# Patient Record
Sex: Male | Born: 1960 | Race: Black or African American | Hispanic: No | Marital: Single | Smoking: Never smoker
Health system: Southern US, Community
[De-identification: ages and names within clinical notes are randomized; demographics above are authoritative.]

## PROBLEM LIST (undated history)

## (undated) DIAGNOSIS — I10 Essential (primary) hypertension: Secondary | ICD-10-CM

## (undated) DIAGNOSIS — Z87442 Personal history of urinary calculi: Secondary | ICD-10-CM

## (undated) DIAGNOSIS — E119 Type 2 diabetes mellitus without complications: Secondary | ICD-10-CM

## (undated) DIAGNOSIS — G4730 Sleep apnea, unspecified: Secondary | ICD-10-CM

## (undated) HISTORY — PX: COLONOSCOPY: SHX174

## (undated) HISTORY — PX: KNEE SURGERY: SHX244

---

## 2006-02-21 ENCOUNTER — Ambulatory Visit: Payer: Self-pay

## 2006-04-12 ENCOUNTER — Ambulatory Visit: Payer: Self-pay | Admitting: General Practice

## 2006-04-30 ENCOUNTER — Emergency Department: Payer: Self-pay | Admitting: Emergency Medicine

## 2009-09-28 ENCOUNTER — Ambulatory Visit: Payer: Self-pay | Admitting: Internal Medicine

## 2009-11-06 ENCOUNTER — Ambulatory Visit: Payer: Self-pay | Admitting: General Practice

## 2009-11-12 ENCOUNTER — Ambulatory Visit: Payer: Self-pay | Admitting: Cardiovascular Disease

## 2009-11-19 ENCOUNTER — Ambulatory Visit: Payer: Self-pay | Admitting: General Practice

## 2012-09-04 ENCOUNTER — Ambulatory Visit: Payer: Self-pay | Admitting: Unknown Physician Specialty

## 2012-09-17 ENCOUNTER — Emergency Department: Payer: Self-pay | Admitting: Emergency Medicine

## 2014-06-21 DIAGNOSIS — R7989 Other specified abnormal findings of blood chemistry: Secondary | ICD-10-CM | POA: Insufficient documentation

## 2016-10-22 ENCOUNTER — Other Ambulatory Visit: Payer: Self-pay | Admitting: Student

## 2016-10-22 DIAGNOSIS — R1310 Dysphagia, unspecified: Secondary | ICD-10-CM

## 2016-11-01 ENCOUNTER — Ambulatory Visit
Admission: RE | Admit: 2016-11-01 | Discharge: 2016-11-01 | Disposition: A | Discharge status: Home / Self Care | Payer: BLUE CROSS/BLUE SHIELD | Source: Ambulatory Visit | Attending: Student | Admitting: Student

## 2016-11-01 DIAGNOSIS — R1310 Dysphagia, unspecified: Secondary | ICD-10-CM | POA: Insufficient documentation

## 2016-11-01 DIAGNOSIS — K219 Gastro-esophageal reflux disease without esophagitis: Secondary | ICD-10-CM | POA: Insufficient documentation

## 2017-05-06 DIAGNOSIS — E1165 Type 2 diabetes mellitus with hyperglycemia: Secondary | ICD-10-CM | POA: Insufficient documentation

## 2017-05-26 ENCOUNTER — Ambulatory Visit: Payer: BLUE CROSS/BLUE SHIELD | Admitting: Dietician

## 2017-05-31 ENCOUNTER — Encounter: Payer: Self-pay | Admitting: Dietician

## 2017-05-31 NOTE — Progress Notes (Signed)
Have not heard back from patient to reschedule his cancelled appointment. Sent letter to referring provider.  

## 2018-02-13 ENCOUNTER — Encounter: Payer: BLUE CROSS/BLUE SHIELD | Admitting: Dietician

## 2018-02-20 ENCOUNTER — Encounter: Payer: Self-pay | Admitting: Dietician

## 2018-02-20 ENCOUNTER — Encounter: Payer: BLUE CROSS/BLUE SHIELD | Attending: Internal Medicine | Admitting: Dietician

## 2018-02-20 VITALS — Ht 68.0 in | Wt 231.6 lb

## 2018-02-20 DIAGNOSIS — E119 Type 2 diabetes mellitus without complications: Secondary | ICD-10-CM

## 2018-02-20 NOTE — Patient Instructions (Addendum)
Balance meals with 3-4 servings of carbohydrate (45-60gms), protein and non-starchy vegetables. Use myfitness pal app to record food/beverage intake. Limit high fat food choices: fried foods, added fats such as margarine, salad dressings, and mayonnaise. Exercise goal:  In addition to dancing, exercise 3 days per week for 45-60 minutes- cardio, core exercises.

## 2018-02-20 NOTE — Progress Notes (Signed)
   Medical Nutrition Therapy: Visit start time: 1330  end time: 1440   Assessment:  Diagnosis: Type 2 DM Past medical history: GERD, hypertension Psychosocial issues/ stress concerns:  Patient rates his stress as moderate and indicates he is dealing "ok" with his stress. He does competitive dancing and list dancing as a way to help cope. His mother has been under care of Hospice and has improved and has recently been released from Hospice care.   Preferred learning method:  . Auditory . Hands-on Current weight: 231.6  Height: 68 in  Medications, supplements: see list  Progress and evaluation:  Patient in for initial medical nutrition therapy appointment. He gives a weight goal of 200 lbs. From 4/'19 to 8/'19, he had a weight gain of 14 lbs. His weight has been stable in the past month. He reports that in the past he has "worked out" but due to health issues with his mother and a demanding work schedule he has not been consistent. He does do competitive dancing. He works 3rd shift most of the time but also goes into work during day hours as well. Most of his meals are "take out" or eaten "out", frequently fast food with high fat content. He states he may cook at home once weekly. He states that with his schedule, most of his meals will continue to be "out". His beverages are water, unsweetened tea with Splenda; sometimes adds lemonade, diet soda. His most recent HgA1c was 8 which was an increase.Marland Kitchen His present diet is high in fat, sodium and low in fruits, vegetables and whole grains.   Physical activity: competitive dancing  Nutrition Care Education:  Weight control/Type 2 Diabetes:  Instructed on a meal plan based on a minimum of 1800 calories and as high as 2300 calories on competitive dance days. Encouraged to record food/beverage intake on a phone app since data base includes restaurants. Discussed making healthier food choices when dining "out".  Nutritional Diagnosis:  [place]-3.3  Overweight/obesity As related to frequent dining out making high fat choices.  As evidenced by diet history..  Intervention: Balance meals with 3-4 servings of carbohydrate (45-60gms), protein and non-starchy vegetables. Discussed difficulty of controlling fat, sodium and calorie intake when dining out frequently. Use myfitness pal app to record food/beverage intake as a tool in being more mindful in making healthier choices. Limit high fat food choices: fried foods, added fats such as margarine, salad dressings, and mayonnaise. Exercise goal:  In addition to dancing, exercise 3 days per week for 45-60 minutes- cardio, core exercises. Education Materials given:  . Plate Planner . Food lists/ Planning A Balanced Meal . Sample meal pattern/ menus . Goals/ instructions Learner/ who was taught:  . Patient  Level of understanding: . Partial understanding; needs review/ practice Demonstrated degree of understanding via:   Teach back Learning barriers: . None Willingness to learn/ readiness for change: . Acceptance, ready for change  Monitoring and Evaluation:  Dietary intake, exercise,  and body weight      follow up: 03/20/18 at 2:00pm

## 2018-03-20 ENCOUNTER — Ambulatory Visit: Payer: BLUE CROSS/BLUE SHIELD | Admitting: Dietician

## 2018-03-30 ENCOUNTER — Encounter: Payer: Self-pay | Admitting: Dietician

## 2018-09-14 IMAGING — RF DG ESOPHAGUS
7 of 8 series · 17 of 23 positions shown · non-contrast
Comparison: None.

CLINICAL DATA: Scratchy throat inflammation

EXAM:
ESOPHOGRAM / BARIUM SWALLOW / BARIUM TABLET STUDY
TECHNIQUE: Combined double contrast and single contrast examination performed
using effervescent crystals, thick barium liquid, and thin barium
liquid. The patient was observed with fluoroscopy swallowing a 13 mm
barium sulphate tablet.
FLUOROSCOPY TIME:  Fluoroscopy Time:  0.8 minutes
Radiation Exposure Index (if provided by the fluoroscopic device): 8
mGy
Number of Acquired Spot Images: 0

[Series 1: cp_standard · 0.52mm/px · 3 of 28 frames shown (1 of 7)]
[frame 2/28]
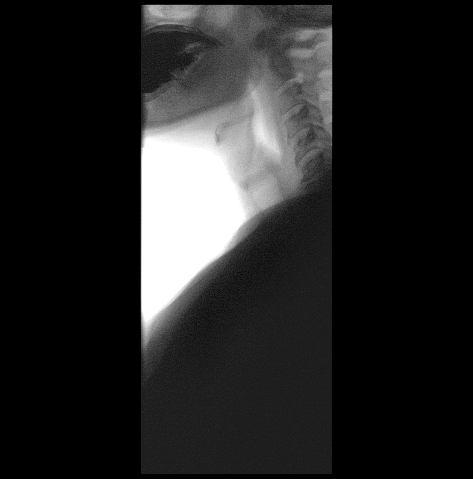
[frame 15/28]
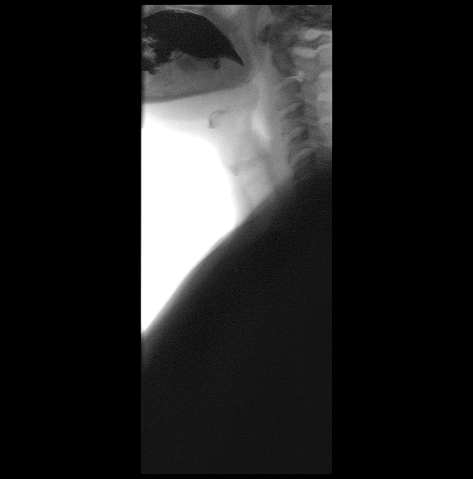
[frame 24/28]
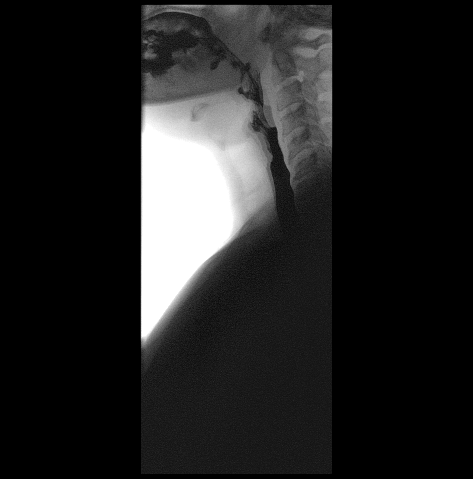

[Series 2: cp_standard · 0.52mm/px · 3 of 62 frames shown (2 of 7)]
[frame 10/62]
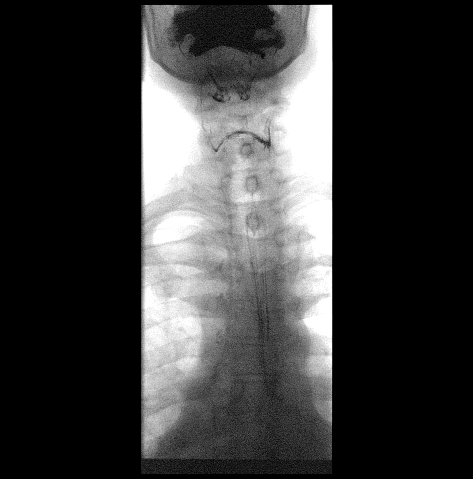
[frame 32/62]
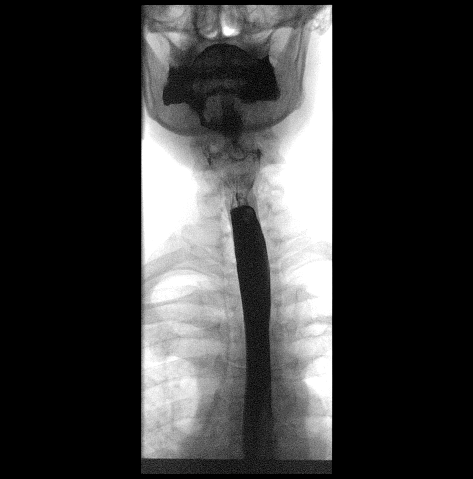
[frame 53/62]
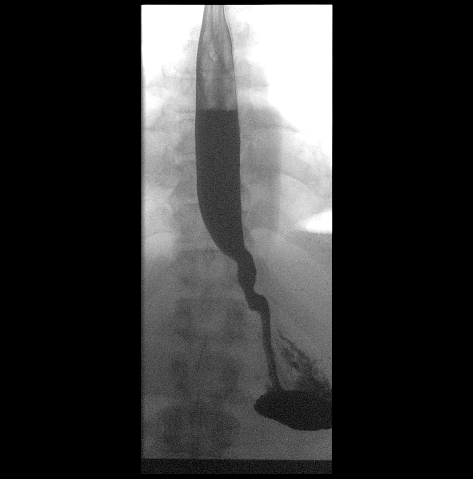

[Series 3: cp_standard · 0.54mm/px · 3 of 55 frames shown (3 of 7)]
[frame 2/55]
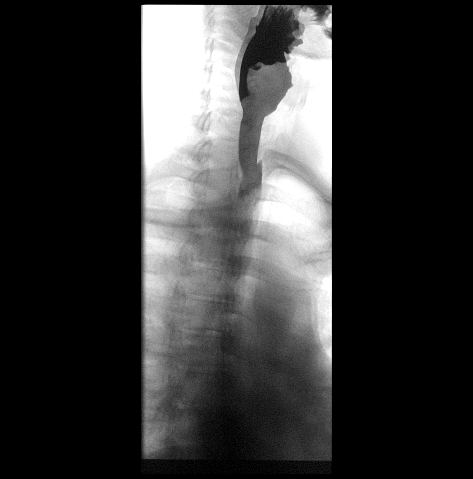
[frame 28/55]
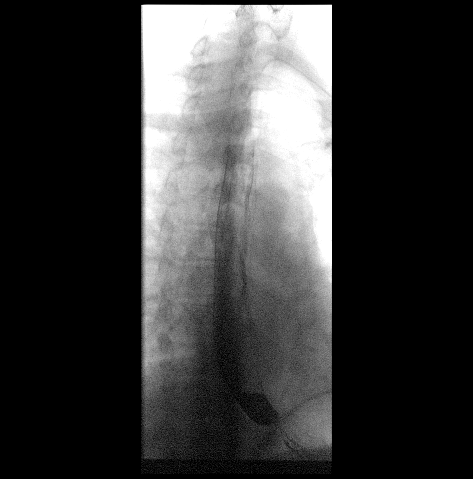
[frame 47/55]
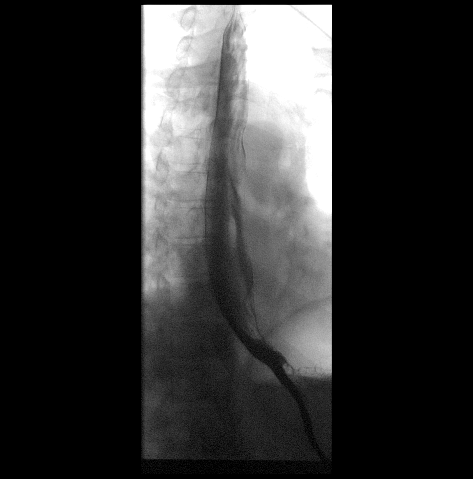

[Series 4: cp_standard · 0.54mm/px · 3 of 8 frames shown (4 of 7)]
[frame 1/8]
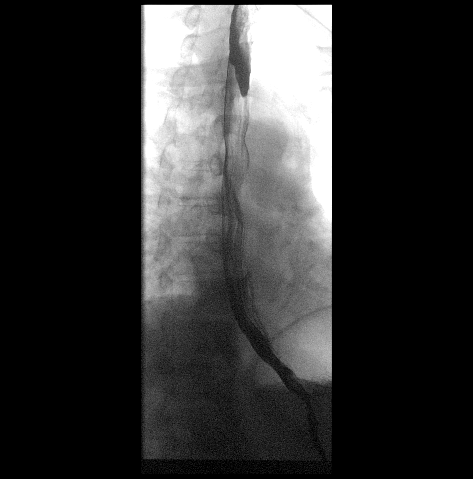
[frame 5/8]
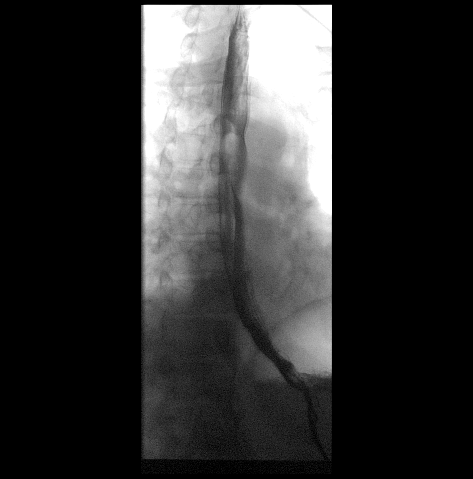
[frame 7/8]
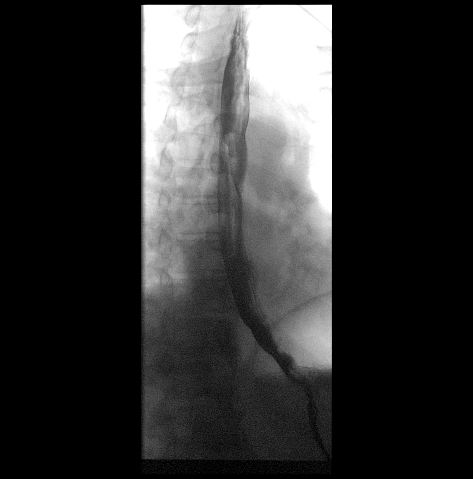

[Series 5: cp_standard · 0.27mm/px · 1 of 1 slices shown (5 of 7)]
[im 1/1]
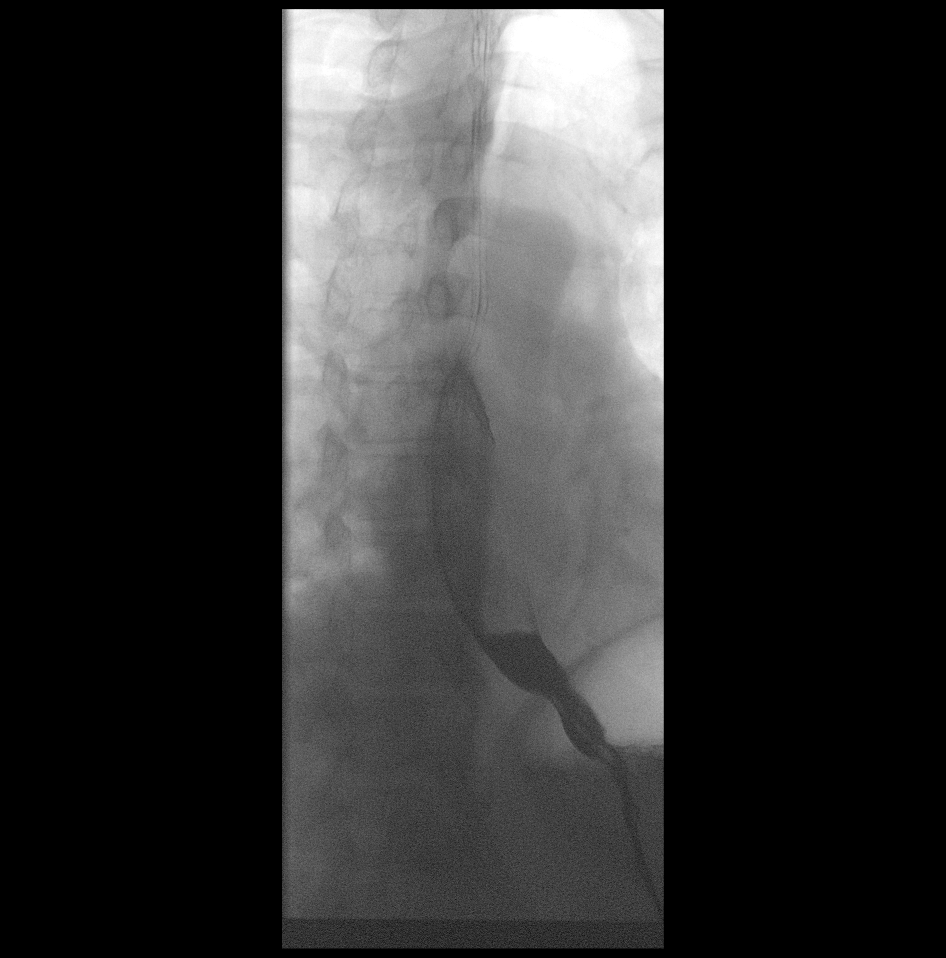

[Series 6: cp_standard · 0.54mm/px · 3 of 41 frames shown (6 of 7)]
[frame 21/41]
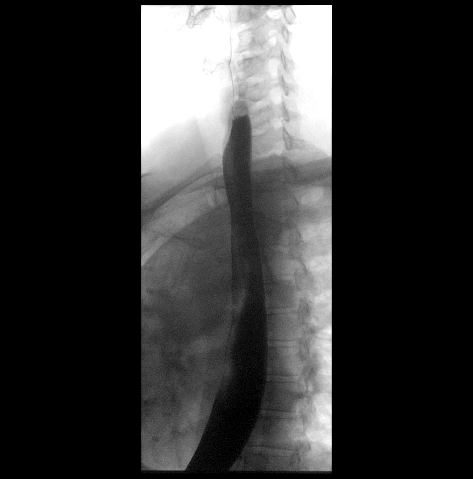
[frame 35/41]
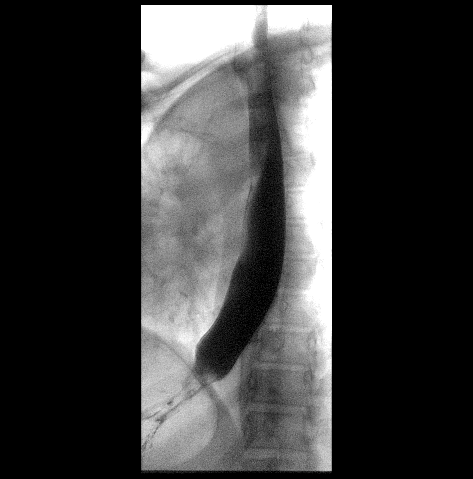
[frame 40/41]
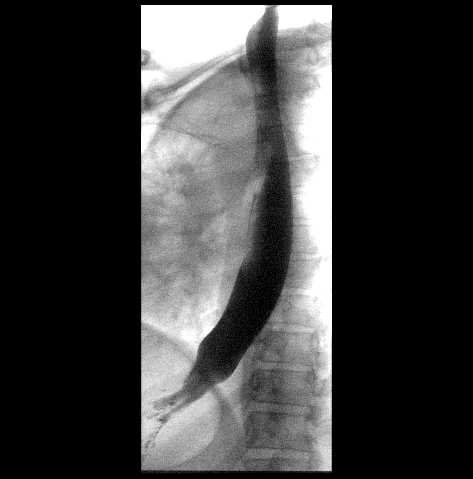

[Series 8: cp_standard · 0.27mm/px · 1 of 1 slices shown (7 of 7)]
[im 1/1]
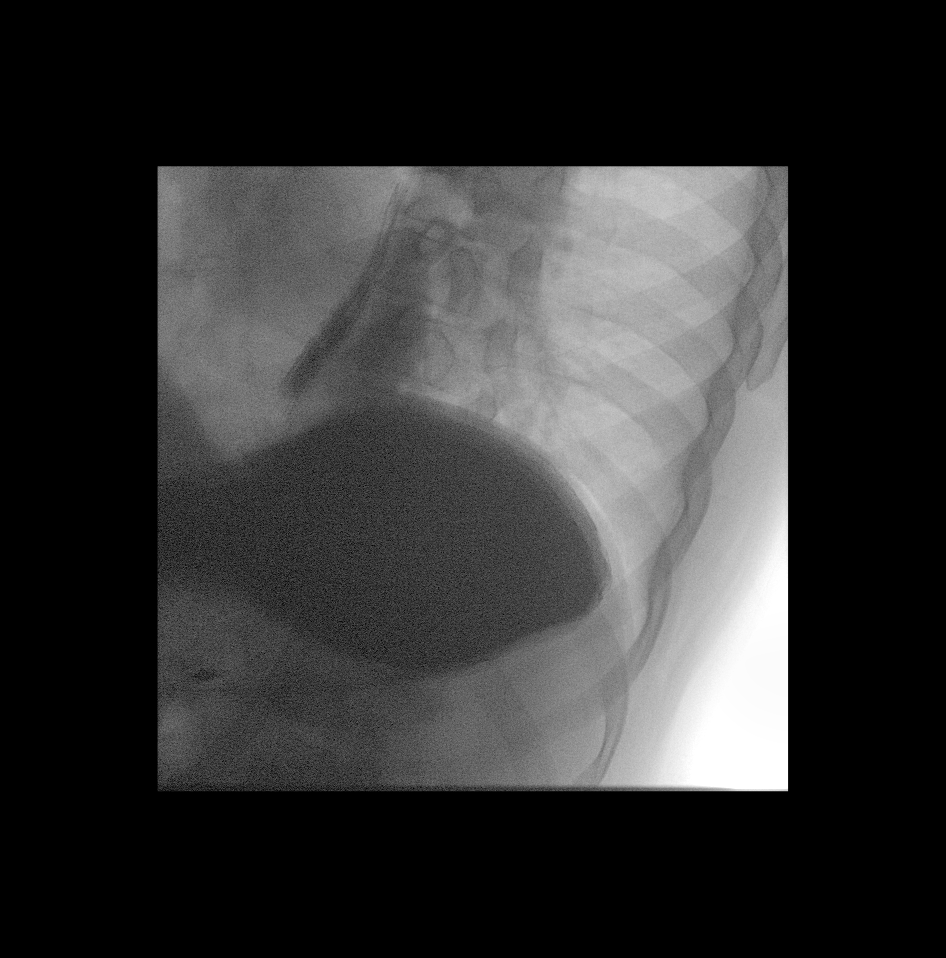

[17 of 23 positions shown; findings below may reference images not displayed]

FINDINGS: There was normal pharyngeal anatomy and motility. Contrast flowed
freely through the esophagus without evidence of stricture or mass.
There was normal esophageal mucosa without evidence of irregularity
or ulceration. Esophageal motility was normal. Minimal
gastroesophageal reflux. No definite hiatal hernia was demonstrated.

At the end of the examination a 13 mm barium tablet was administered
which transited through the esophagus and esophagogastric junction
without delay.
IMPRESSION: 1. Minimal gastroesophageal reflux. Otherwise normal barium swallow.

## 2022-06-02 ENCOUNTER — Emergency Department
Admission: EM | Admit: 2022-06-02 | Discharge: 2022-06-02 | Disposition: A | Discharge status: Home / Self Care | Payer: 59 | Attending: Emergency Medicine | Admitting: Emergency Medicine

## 2022-06-02 ENCOUNTER — Other Ambulatory Visit: Payer: Self-pay

## 2022-06-02 DIAGNOSIS — E119 Type 2 diabetes mellitus without complications: Secondary | ICD-10-CM | POA: Diagnosis not present

## 2022-06-02 DIAGNOSIS — M25512 Pain in left shoulder: Secondary | ICD-10-CM | POA: Insufficient documentation

## 2022-06-02 DIAGNOSIS — G5622 Lesion of ulnar nerve, left upper limb: Secondary | ICD-10-CM | POA: Insufficient documentation

## 2022-06-02 DIAGNOSIS — I10 Essential (primary) hypertension: Secondary | ICD-10-CM | POA: Insufficient documentation

## 2022-06-02 DIAGNOSIS — M542 Cervicalgia: Secondary | ICD-10-CM | POA: Insufficient documentation

## 2022-06-02 MED ORDER — TRAMADOL HCL 50 MG PO TABS: 50.0000 mg | ORAL_TABLET | Freq: Four times a day (QID) | ORAL | 0 refills | Status: DC | PRN

## 2022-06-02 MED ORDER — PREDNISONE 10 MG (21) PO TBPK: ORAL_TABLET | ORAL | 0 refills | Status: DC

## 2022-06-02 NOTE — ED Triage Notes (Addendum)
Pt has been having pain in the left arm for 8 mos, was diagnosed with a possible pinched nerve and placed on a muscle relaxer, states that it got better, but Sunday the pain started to return, pt states the pain is progressively getting worse throughout the day. Pt states that every time he turns his neck to the right the pain is worse and states 3 of his fingers become numb on the left hand

## 2022-06-02 NOTE — ED Provider Notes (Signed)
[facility] Provider Note    Event Date/Time   First MD Initiated Contact with Patient 06/02/22 1543     (approximate)   History   Shoulder Pain   HPI  Billy Alvarado is a 62 y.o. male with history of hypertension, GERD, type 2 diabetes, and remaining as listed in the EMR presents to the emergency department for treatment and evaluation of pain in the left neck/shoulder that radiates down the arm into the third fourth and fifth fingers.  Symptoms have been intermittent for the past 8 months and he has been prescribed muscle relaxer.  3 days ago, symptoms returned and have been progressively worsening.  He is not experiencing weakness in his left hand but reports numbness and tingling in the third fourth and fifth fingers.  Pain increases with movement of the arm and neck.  No new injury.     Physical Exam   Triage Vital Signs: ED Triage Vitals  Enc Vitals Group     BP 06/02/22 1521 (!) 166/97     Pulse Rate 06/02/22 1521 83     Resp 06/02/22 1521 16     Temp 06/02/22 1521 98 F (36.7 C)     Temp Source 06/02/22 1521 Oral     SpO2 06/02/22 1521 97 %     Weight 06/02/22 1522 225 lb (102.1 kg)     Height 06/02/22 1522 5\' 8"  (1.727 m)     Head Circumference --      Peak Flow --      Pain Score 06/02/22 1522 8     Pain Loc --      Pain Edu? --      Excl. in [place]? --     Most recent vital signs: Vitals:   06/02/22 1521  BP: (!) 166/97  Pulse: 83  Resp: 16  Temp: 98 F (36.7 C)  SpO2: 97%     General: Awake, no distress.  CV:  Good peripheral perfusion.  Resp:  Normal effort.  Abd:  No distention.  Other:  Grip strength of the hands is equal.    ED Results / Procedures / Treatments   Labs (all labs ordered are listed, but only abnormal results are displayed) Labs Reviewed - No data to display   EKG     RADIOLOGY  Not indicated.  PROCEDURES:  Critical Care performed: No  Procedures   MEDICATIONS ORDERED IN ED: Medications  - No data to display   IMPRESSION / MDM / [place] / ED COURSE  I reviewed the triage vital signs and the nursing notes.                              Differential diagnosis includes, but is not limited to, cervical radiculopathy, ulnar nerve impingement, carpal tunnel  Patient's presentation is most consistent with acute illness / injury with system symptoms.  62 year old male presenting to the emergency department for treatment and evaluation of left side radiculopathy from the axillary area into the ulnar notch of the left elbow and into the third fourth and fifth fingers of the left hand.  On exam, he is still able to perform range of motion and grip strength is equal but he reports decrease in sensation.  Hand is warm and capillary refill is less than 3 seconds.  Plan will be to treat him with prednisone taper and tramadol.  He was encouraged to call and schedule  follow-up appointment with orthopedics (no in-house neurology coverage listed today).  If symptoms change or worsen and he is unable to see primary care or orthopedics he is to return to the emergency department.     FINAL CLINICAL IMPRESSION(S) / ED DIAGNOSES   Final diagnoses:  Ulnar nerve impingement, left     Rx / DC Orders   ED Discharge Orders          Ordered    predniSONE (STERAPRED UNI-PAK 21 TAB) 10 MG (21) TBPK tablet        06/02/22 1618    traMADol (ULTRAM) 50 MG tablet  Every 6 hours PRN        06/02/22 1618             Note:  This document was prepared using Dragon voice recognition software and may include unintentional dictation errors.   Victorino Dike, FNP 06/02/22 2006    Lucillie Garfinkel, MD 06/03/22 2329

## 2022-06-02 NOTE — Discharge Instructions (Signed)
Please call and schedule a follow up with primary care or orthopedics.  Take the medication as prescribed an until finished.  Return to the ER for symptoms that change or worsen if unable to schedule an appointment.

## 2023-04-27 ENCOUNTER — Encounter: Payer: Self-pay | Admitting: Emergency Medicine

## 2023-04-27 ENCOUNTER — Emergency Department
Admission: EM | Admit: 2023-04-27 | Discharge: 2023-04-27 | Disposition: A | Discharge status: Home / Self Care | Payer: 59 | Attending: Emergency Medicine | Admitting: Emergency Medicine

## 2023-04-27 ENCOUNTER — Other Ambulatory Visit: Payer: Self-pay

## 2023-04-27 ENCOUNTER — Emergency Department: Payer: 59

## 2023-04-27 DIAGNOSIS — I10 Essential (primary) hypertension: Secondary | ICD-10-CM | POA: Insufficient documentation

## 2023-04-27 DIAGNOSIS — E119 Type 2 diabetes mellitus without complications: Secondary | ICD-10-CM | POA: Diagnosis not present

## 2023-04-27 DIAGNOSIS — N132 Hydronephrosis with renal and ureteral calculous obstruction: Secondary | ICD-10-CM | POA: Diagnosis not present

## 2023-04-27 DIAGNOSIS — R1031 Right lower quadrant pain: Secondary | ICD-10-CM | POA: Diagnosis present

## 2023-04-27 DIAGNOSIS — N200 Calculus of kidney: Secondary | ICD-10-CM

## 2023-04-27 HISTORY — DX: Essential (primary) hypertension: I10

## 2023-04-27 HISTORY — DX: Type 2 diabetes mellitus without complications: E11.9

## 2023-04-27 LAB — URINALYSIS, ROUTINE W REFLEX MICROSCOPIC
Bacteria, UA: NONE SEEN
Bilirubin Urine: NEGATIVE
Glucose, UA: >=500 mg/dL — AB
Hgb urine dipstick: NEGATIVE
Ketones, ur: 5 mg/dL — AB
Leukocytes,Ua: NEGATIVE
Nitrite: NEGATIVE
Protein, ur: NEGATIVE mg/dL
Specific Gravity, Urine: 1.018 (ref 1.005–1.030)
Squamous Epithelial / HPF: 0 /[HPF] (ref 0–5)
pH: 6 (ref 5.0–8.0)

## 2023-04-27 LAB — COMPREHENSIVE METABOLIC PANEL
ALT: 27 U/L (ref 0–44)
AST: 31 U/L (ref 15–41)
Albumin: 4.5 g/dL (ref 3.5–5.0)
Alkaline Phosphatase: 54 U/L (ref 38–126)
Anion gap: 12 (ref 5–15)
BUN: 21 mg/dL (ref 8–23)
CO2: 20 mmol/L — ABNORMAL LOW (ref 22–32)
Calcium: 9.4 mg/dL (ref 8.9–10.3)
Chloride: 106 mmol/L (ref 98–111)
Creatinine, Ser: 1.73 mg/dL — ABNORMAL HIGH (ref 0.61–1.24)
GFR, Estimated: 44 mL/min — ABNORMAL LOW (ref >60)
Glucose, Bld: 275 mg/dL — ABNORMAL HIGH (ref 70–99)
Potassium: 4.1 mmol/L (ref 3.5–5.1)
Sodium: 138 mmol/L (ref 135–145)
Total Bilirubin: 1.1 mg/dL (ref <1.2)
Total Protein: 7.6 g/dL (ref 6.5–8.1)

## 2023-04-27 LAB — CBC
HCT: 44.2 % (ref 39.0–52.0)
Hemoglobin: 15 g/dL (ref 13.0–17.0)
MCH: 28.7 pg (ref 26.0–34.0)
MCHC: 33.9 g/dL (ref 30.0–36.0)
MCV: 84.7 fL (ref 80.0–100.0)
Platelets: 277 10*3/uL (ref 150–400)
RBC: 5.22 MIL/uL (ref 4.22–5.81)
RDW: 12.2 % (ref 11.5–15.5)
WBC: 10 10*3/uL (ref 4.0–10.5)
nRBC: 0 % (ref 0.0–0.2)

## 2023-04-27 LAB — LIPASE, BLOOD: Lipase: 26 U/L (ref 11–51)

## 2023-04-27 MED ORDER — LACTATED RINGERS IV BOLUS
1000.0000 mL | Freq: Once | INTRAVENOUS | Status: AC
  Administered 2023-04-27: 1000 mL via INTRAVENOUS

## 2023-04-27 MED ORDER — OXYCODONE-ACETAMINOPHEN 5-325 MG PO TABS
1.0000 | ORAL_TABLET | Freq: Once | ORAL | Status: AC
  Administered 2023-04-27: 1 via ORAL
  Filled 2023-04-27: qty 1

## 2023-04-27 MED ORDER — OXYCODONE-ACETAMINOPHEN 5-325 MG PO TABS: 1.0000 | ORAL_TABLET | ORAL | 0 refills | Status: DC | PRN

## 2023-04-27 MED ORDER — ONDANSETRON 4 MG PO TBDP
4.0000 mg | ORAL_TABLET | Freq: Three times a day (TID) | ORAL | 0 refills | Status: DC | PRN
Start: 2023-04-27 — End: 2023-06-17

## 2023-04-27 MED ORDER — MORPHINE SULFATE (PF) 2 MG/ML IV SOLN
2.0000 mg | Freq: Once | INTRAVENOUS | Status: AC
  Administered 2023-04-27: 2 mg via INTRAVENOUS
  Filled 2023-04-27: qty 1

## 2023-04-27 MED ORDER — ONDANSETRON HCL 4 MG/2ML IJ SOLN
4.0000 mg | Freq: Once | INTRAMUSCULAR | Status: AC
  Administered 2023-04-27: 4 mg via INTRAVENOUS
  Filled 2023-04-27: qty 2

## 2023-04-27 MED ORDER — IOHEXOL 300 MG/ML  SOLN
80.0000 mL | Freq: Once | INTRAMUSCULAR | Status: AC | PRN
Start: 2023-04-27 — End: 2023-04-27
  Administered 2023-04-27: 80 mL via INTRAVENOUS

## 2023-04-27 NOTE — ED Provider Notes (Signed)
[facility] Provider Note    Event Date/Time   First MD Initiated Contact with Patient 04/27/23 1528     (approximate)   History   Chief Complaint Abdominal Pain   HPI  Billy Alvarado is a 62 y.o. male with past medical history of hypertension and diabetes who presents to the ED complaining of abdominal pain.  Patient reports that he has been dealing with pain in the right lower quadrant of his abdomen since waking up this morning.  This been associated with nausea and multiple episodes of vomiting as well as some loose stool, but he denies any blood in his emesis or stool.  He reports some subjective fevers but has not had any dysuria, hematuria, or flank pain.  He denies any history of similar symptoms, still has both his gallbladder and appendix.     Physical Exam   Triage Vital Signs: ED Triage Vitals [04/27/23 1404]  Encounter Vitals Group     BP (!) 162/83     Systolic BP Percentile      Diastolic BP Percentile      Pulse Rate 99     Resp 16     Temp 98.3 F (36.8 C)     Temp Source Oral     SpO2 98 %     Weight      Height      Head Circumference      Peak Flow      Pain Score 6     Pain Loc      Pain Education      Exclude from Growth Chart     Most recent vital signs: Vitals:   04/27/23 1404 04/27/23 1610  BP: (!) 162/83 (!) 159/90  Pulse: 99 87  Resp: 16 16  Temp: 98.3 F (36.8 C)   SpO2: 98% 96%    Constitutional: Alert and oriented. Eyes: Conjunctivae are normal. Head: Atraumatic. Nose: No congestion/rhinnorhea. Mouth/Throat: Mucous membranes are moist.  Cardiovascular: Normal rate, regular rhythm. Grossly normal heart sounds.  2+ radial pulses bilaterally. Respiratory: Normal respiratory effort.  No retractions. Lungs CTAB. Gastrointestinal: Soft and tender to palpation in the right lower quadrant with no rebound or guarding. No distention. Musculoskeletal: No lower extremity tenderness nor edema.  Neurologic:  Normal  speech and language. No gross focal neurologic deficits are appreciated.    ED Results / Procedures / Treatments   Labs (all labs ordered are listed, but only abnormal results are displayed) Labs Reviewed  COMPREHENSIVE METABOLIC PANEL - Abnormal; Notable for the following components:      Result Value   CO2 20 (*)    Glucose, Bld 275 (*)    Creatinine, Ser 1.73 (*)    GFR, Estimated 44 (*)    All other components within normal limits  URINALYSIS, ROUTINE W REFLEX MICROSCOPIC - Abnormal; Notable for the following components:   Color, Urine YELLOW (*)    APPearance CLEAR (*)    Glucose, UA >=500 (*)    Ketones, ur 5 (*)    All other components within normal limits  LIPASE, BLOOD  CBC   RADIOLOGY CT abdomen/pelvis reviewed and interpreted by me with kidney stone at the right UVJ and associated hydronephrosis.  PROCEDURES:  Critical Care performed: No  Procedures   MEDICATIONS ORDERED IN ED: Medications  oxyCODONE-acetaminophen (PERCOCET/ROXICET) 5-325 MG per tablet 1 tablet (has no administration in time range)  lactated ringers bolus 1,000 mL (1,000 mLs Intravenous New Bag/Given 04/27/23 1607)  ondansetron (ZOFRAN) injection 4 mg (4 mg Intravenous Given 04/27/23 1600)  morphine (PF) 2 MG/ML injection 2 mg (2 mg Intravenous Given 04/27/23 1600)  iohexol (OMNIPAQUE) 300 MG/ML solution 80 mL (80 mLs Intravenous Contrast Given 04/27/23 1623)     IMPRESSION / MDM / ASSESSMENT AND PLAN / ED COURSE  I reviewed the triage vital signs and the nursing notes.                              62 y.o. male with past medical history of hypertension and diabetes who presents to the ED complaining of right lower quadrant abdominal pain with nausea and vomiting since this morning.  Patient's presentation is most consistent with acute presentation with potential threat to life or bodily function.  Differential diagnosis includes, but is not limited to, appendicitis, diverticulitis,  cholecystitis, biliary colic, hepatitis, pancreatitis, dehydration, electrolyte abnormality, AKI, gastroenteritis, UTI.  Patient nontoxic-appearing and in no acute distress, vital signs are unremarkable.  His abdomen is soft but he does seem to have some tenderness in the right lower quadrant.  Will further assess with CT imaging, treat symptomatically with IV Zofran and morphine, hydrate with IV fluids.  Labs show mild AKI but are otherwise reassuring with no significant anemia, leukocytosis, electrolyte abnormality.  LFTs and lipase are unremarkable.  Urinalysis shows no signs of infection.  CT imaging shows 3 mm stone at the right UVJ with associated hydronephrosis.  There is also cholelithiasis but no evidence of cholecystitis.  Kidney stone correlates with patient's area of pain and pain is improved on reassessment, patient tolerating oral intake.  He is appropriate for outpatient management, urology follow-up provided along with prescription for pain and nausea medication.  He was counseled to return to the ED for new or worsening symptoms.  Patient agrees with plan.      FINAL CLINICAL IMPRESSION(S) / ED DIAGNOSES   Final diagnoses:  Kidney stone     Rx / DC Orders   ED Discharge Orders          Ordered    oxyCODONE-acetaminophen (PERCOCET) 5-325 MG tablet  Every 4 hours PRN        04/27/23 1821    ondansetron (ZOFRAN-ODT) 4 MG disintegrating tablet  Every 8 hours PRN        04/27/23 1821             Note:  This document was prepared using Dragon voice recognition software and may include unintentional dictation errors.   Chesley Noon, MD 04/27/23 Rickey Primus

## 2023-04-27 NOTE — ED Triage Notes (Signed)
C/O nausea, vomiting, abd pain today.  Also c/o chills

## 2023-06-14 ENCOUNTER — Other Ambulatory Visit: Payer: Self-pay

## 2023-06-14 DIAGNOSIS — N529 Male erectile dysfunction, unspecified: Secondary | ICD-10-CM | POA: Insufficient documentation

## 2023-06-14 DIAGNOSIS — I10 Essential (primary) hypertension: Secondary | ICD-10-CM | POA: Insufficient documentation

## 2023-06-14 DIAGNOSIS — K219 Gastro-esophageal reflux disease without esophagitis: Secondary | ICD-10-CM | POA: Insufficient documentation

## 2023-06-14 DIAGNOSIS — N200 Calculus of kidney: Secondary | ICD-10-CM

## 2023-06-17 ENCOUNTER — Other Ambulatory Visit
Admission: RE | Admit: 2023-06-17 | Discharge: 2023-06-17 | Disposition: A | Discharge status: Home / Self Care | Payer: 59 | Attending: Urology | Admitting: Urology

## 2023-06-17 ENCOUNTER — Ambulatory Visit: Payer: 59 | Admitting: Urology

## 2023-06-17 VITALS — BP 118/77 | HR 71 | Ht 68.0 in | Wt 223.5 lb

## 2023-06-17 DIAGNOSIS — N200 Calculus of kidney: Secondary | ICD-10-CM

## 2023-06-17 DIAGNOSIS — N289 Disorder of kidney and ureter, unspecified: Secondary | ICD-10-CM | POA: Diagnosis not present

## 2023-06-17 LAB — URINALYSIS, COMPLETE (UACMP) WITH MICROSCOPIC
Bilirubin Urine: NEGATIVE
Glucose, UA: NEGATIVE mg/dL
Hgb urine dipstick: NEGATIVE
Leukocytes,Ua: NEGATIVE
Nitrite: NEGATIVE
Protein, ur: NEGATIVE mg/dL
Specific Gravity, Urine: 1.025 (ref 1.005–1.030)
pH: 5.5 (ref 5.0–8.0)

## 2023-06-18 NOTE — Progress Notes (Signed)
I,Amy L Pierron,acting as a scribe for Vanna Scotland, MD.,have documented all relevant documentation on the behalf of Vanna Scotland, MD,as directed by  Vanna Scotland, MD while in the presence of Vanna Scotland, MD.  06/17/2023 9:38 AM   Billy Alvarado [birth year] [id number]  Referring provider: Barbette Reichmann, MD [address] [facility] [place],  [place] [address]  Chief Complaint  Patient presents with   Establish Care   Nephrolithiasis    HPI: 63 year-old male presents today for a follow-up of a kidney stone event.   He presented to the emergency room on 04/27/2023 with fairly acute onset right lower abdominal pain with associated nausea and vomiting. He had no GU complaints. His creatinine was elevated to 1.73. CT scan indicated a right UVJ 3 mm stone with associated hydronephrosis. He's got a punctate, left lower pole stone, but no additional significant stone material. His pain was able to be controlled  and he was discharged with outpatient follow-up.  His baseline creatinine is 1.3.   He increased his water intake and did have some burning with urination. He also thought his stream was weaker. His pain has gone away and he still occasionally has a weak stream but is not bothersome to him.  He has been trying to exercise more and change his eating habits lose weight.  Results for orders placed or performed during the hospital encounter of 06/17/23  Urinalysis, Complete w Microscopic -  Result Value Ref Range   Color, Urine YELLOW YELLOW   APPearance CLEAR CLEAR   Specific Gravity, Urine 1.025 1.005 - 1.030   pH 5.5 5.0 - 8.0   Glucose, UA NEGATIVE NEGATIVE mg/dL   Hgb urine dipstick NEGATIVE NEGATIVE   Bilirubin Urine NEGATIVE NEGATIVE   Ketones, ur TRACE (A) NEGATIVE mg/dL   Protein, ur NEGATIVE NEGATIVE mg/dL   Nitrite NEGATIVE NEGATIVE   Leukocytes,Ua NEGATIVE NEGATIVE   Squamous Epithelial / HPF 0-5 0 - 5 /HPF   WBC, UA 0-5 0 - 5 WBC/hpf   RBC /  HPF 6-10 0 - 5 RBC/hpf   Bacteria, UA RARE (A) NONE SEEN   Mucus PRESENT     PMH: Past Medical History:  Diagnosis Date   Diabetes mellitus without complication (HCC)    Hypertension     Surgical History: N/a  Home Medications:  Allergies as of 06/17/2023   No Known Allergies      Medication List        Accurate as of June 17, 2023 11:59 PM. If you have any questions, ask your nurse or doctor.          STOP taking these medications    ondansetron 4 MG disintegrating tablet Commonly known as: ZOFRAN-ODT   oxyCODONE-acetaminophen 5-325 MG tablet Commonly known as: Percocet   predniSONE 10 MG (21) Tbpk tablet Commonly known as: STERAPRED UNI-PAK 21 TAB       TAKE these medications    amLODipine 5 MG tablet Commonly known as: NORVASC Take 5 mg by mouth daily.   glimepiride 4 MG tablet Commonly known as: AMARYL Take 4 mg by mouth daily with breakfast. What changed: Another medication with the same name was removed. Continue taking this medication, and follow the directions you see here.   lisinopril 20 MG tablet Commonly known as: ZESTRIL TAKE 1 TABLET BY MOUTH EVERY DAY   metFORMIN 500 MG tablet Commonly known as: GLUCOPHAGE TAKE 1 TABLET BY MOUTH EVERY DAY WITH BREAKFAST   Vitamin D (Ergocalciferol) 1.25  MG (50000 UNIT) Caps capsule Commonly known as: DRISDOL        Social History:  reports that he has never smoked. He has never used smokeless tobacco. He reports current alcohol use of about 1.0 standard drink of alcohol per week. No history on file for drug use.   Physical Exam: BP 118/77   Pulse 71   Ht 5\' 8"  (1.727 m)   Wt 223 lb 8 oz (101.4 kg)   BMI 33.98 kg/m   Constitutional:  Alert and oriented, No acute distress. HEENT: [place] AT, moist mucus membranes.  Trachea midline, no masses. Neurologic: Grossly intact, no focal deficits, moving all 4 extremities. Psychiatric: Normal mood and affect.  Urinalysis    Component Value  Date/Time   COLORURINE YELLOW 06/17/2023 1444   APPEARANCEUR CLEAR 06/17/2023 1444   LABSPEC 1.025 06/17/2023 1444   PHURINE 5.5 06/17/2023 1444   GLUCOSEU NEGATIVE 06/17/2023 1444   HGBUR NEGATIVE 06/17/2023 1444   BILIRUBINUR NEGATIVE 06/17/2023 1444   KETONESUR TRACE (A) 06/17/2023 1444   PROTEINUR NEGATIVE 06/17/2023 1444   NITRITE NEGATIVE 06/17/2023 1444   LEUKOCYTESUR NEGATIVE 06/17/2023 1444    Lab Results  Component Value Date   BACTERIA RARE (A) 06/17/2023    Pertinent Imaging: Narrative & Impression  CLINICAL DATA:  Right lower quadrant abdominal pain. Nausea and vomiting.   EXAM: CT ABDOMEN AND PELVIS WITH CONTRAST   TECHNIQUE: Multidetector CT imaging of the abdomen and pelvis was performed using the standard protocol following bolus administration of intravenous contrast.   RADIATION DOSE REDUCTION: This exam was performed according to the departmental dose-optimization program which includes automated exposure control, adjustment of the mA and/or kV according to patient size and/or use of iterative reconstruction technique.   CONTRAST:  80mL OMNIPAQUE IOHEXOL 300 MG/ML  SOLN   COMPARISON:  None Available.   FINDINGS: Lower chest: No acute abnormality.   Hepatobiliary: No focal liver abnormality is seen. Cholelithiasis. No gallbladder wall thickening or pericholecystic fluid. No biliary dilatation.   Pancreas: Unremarkable. No pancreatic ductal dilatation or surrounding inflammatory changes.   Spleen: Normal in size without focal abnormality.   Adrenals/Urinary Tract: Adrenal glands are unremarkable. Delayed right renal nephrogram. 3 mm calculus in the bladder at the level of the right ureterovesicular junction with moderate associated hydroureteronephrosis. There is right perinephric stranding. No additional right-sided renal calculi identified. Nonobstructing 2 mm calculus at the inferior pole of the left kidney. No left-sided hydronephrosis.  No suspicious focal lesion.   Stomach/Bowel: Stomach is within normal limits. Appendix appears normal. No evidence of bowel wall thickening, distention, or inflammatory changes. Scattered left colonic diverticulosis without evidence of acute diverticulitis.   Vascular/Lymphatic: No significant vascular findings are present. No enlarged abdominal or pelvic lymph nodes.   Reproductive: Prostate is unremarkable.   Other: Small fat containing bilateral inguinal hernias. Small fat containing umbilical hernia. No abdominopelvic ascites. No intraperitoneal free air.   Musculoskeletal: No acute osseous abnormality. No suspicious osseous lesion. Moderate degenerative changes of the bilateral hips. Degenerative changes of the pubic symphysis with subcortical cystic changes of the right pubic body.   IMPRESSION: 1. 3 mm calculus in the bladder at the level of the right ureterovesicular junction with associated moderate right-sided hydroureteronephrosis and obstructive uropathy. 2. Nonobstructing punctate calculus in the inferior pole of the left kidney. No left-sided hydronephrosis. 3. Cholelithiasis without evidence of acute cholecystitis.   Electronically Signed   By: Hart Robinsons M.D.   On: 04/27/2023 17:58    Personally reviewed  the above images and agree with radiologic interpretation.   Assessment & Plan:    1. Kidney stones  - Likely passed the right sided one on his own.   - Uncertain how long the lower left pole stone has been present. Due to size and location don't recommend any treatment at this point in time.   - We discussed general stone prevention techniques including drinking plenty water with goal of producing 2.5 L urine daily, increased citric acid intake, avoidance of high oxalate containing foods, and decreased salt intake.  Information about dietary recommendations given today.   2. Acute renal insufficiency   - Likely a result of recent stone event. PCP did  a repeat BMP and his creatinine returned to baseline at 1.3.  Return if symptoms worsen or fail to improve.  I have reviewed the above documentation for accuracy and completeness, and I agree with the above.   Vanna Scotland, MD   [facility] [address], [address] [place], [place] [address] [phone]

## 2023-07-03 NOTE — H&P (Signed)
 Pre-Procedure H&P   Patient ID: Billy Alvarado is a 63 y.o. male.  Gastroenterology Provider: Quintin Buckle, DO  Referring Provider: Dr. Kevan Peers PCP: Antonio Baumgarten, MD  Date: 07/04/2023  HPI Mr. Billy Alvarado is a 63 y.o. male who presents today for Colonoscopy for Colorectal cancer screening .  No family history of colon cancer or colon polyps.  Reports daily bowel movement. Last underwent colonoscopy in April 2014 only demonstrating internal hemorrhoids   Past Medical History:  Diagnosis Date   Diabetes mellitus without complication (HCC)    History of kidney stones    Hypertension    Sleep apnea     Past Surgical History:  Procedure Laterality Date   COLONOSCOPY     X12 YRS AGO   KNEE SURGERY Bilateral     Family History No h/o GI disease or malignancy  Review of Systems  Constitutional:  Negative for activity change, appetite change, chills, diaphoresis, fatigue, fever and unexpected weight change.  HENT:  Negative for trouble swallowing and voice change.   Respiratory:  Negative for shortness of breath and wheezing.   Cardiovascular:  Negative for chest pain, palpitations and leg swelling.  Gastrointestinal:  Negative for abdominal distention, abdominal pain, anal bleeding, blood in stool, constipation, diarrhea, nausea and vomiting.  Musculoskeletal:  Negative for arthralgias and myalgias.  Skin:  Negative for color change and pallor.  Neurological:  Negative for dizziness, syncope and weakness.  Psychiatric/Behavioral:  Negative for confusion. The patient is not nervous/anxious.   All other systems reviewed and are negative.    Medications No current facility-administered medications on file prior to encounter.   Current Outpatient Medications on File Prior to Encounter  Medication Sig Dispense Refill   lisinopril (PRINIVIL,ZESTRIL) 20 MG tablet TAKE 1 TABLET BY MOUTH EVERY DAY     Vitamin D, Ergocalciferol, (DRISDOL) 50000 units CAPS capsule   0    metFORMIN (GLUCOPHAGE) 500 MG tablet TAKE 1 TABLET BY MOUTH EVERY DAY WITH BREAKFAST      Pertinent medications related to GI and procedure were reviewed by me with the patient prior to the procedure   Current Facility-Administered Medications:    0.9 %  sodium chloride  infusion, , Intravenous, Continuous, Quintin Buckle, DO, Last Rate: 20 mL/hr at 07/04/23 0825, New Bag at 07/04/23 0825  sodium chloride  20 mL/hr at 07/04/23 0825       No Known Allergies Allergies were reviewed by me prior to the procedure  Objective   Body mass index is 33.75 kg/m. Vitals:   07/04/23 0829  BP: 131/78  Pulse: 72  Resp: 16  Temp: (!) 96.9 F (36.1 C)  TempSrc: Temporal  SpO2: 99%  Weight: 100.7 kg  Height: 5\' 8"  (1.727 m)     Physical Exam Vitals and nursing note reviewed.  Constitutional:      General: He is not in acute distress.    Appearance: Normal appearance. He is not ill-appearing, toxic-appearing or diaphoretic.  HENT:     Head: Normocephalic and atraumatic.     Nose: Nose normal.     Mouth/Throat:     Mouth: Mucous membranes are moist.     Pharynx: Oropharynx is clear.  Eyes:     General: No scleral icterus.    Extraocular Movements: Extraocular movements intact.  Cardiovascular:     Rate and Rhythm: Normal rate and regular rhythm.     Heart sounds: Normal heart sounds. No murmur heard.    No friction rub. No gallop.  Pulmonary:  Effort: Pulmonary effort is normal. No respiratory distress.     Breath sounds: Normal breath sounds. No wheezing, rhonchi or rales.  Abdominal:     General: Bowel sounds are normal. There is no distension.     Palpations: Abdomen is soft.     Tenderness: There is no abdominal tenderness. There is no guarding or rebound.  Musculoskeletal:     Cervical back: Neck supple.     Right lower leg: No edema.     Left lower leg: No edema.  Skin:    General: Skin is warm and dry.     Coloration: Skin is not jaundiced or pale.   Neurological:     General: No focal deficit present.     Mental Status: He is alert and oriented to person, place, and time. Mental status is at baseline.  Psychiatric:        Mood and Affect: Mood normal.        Behavior: Behavior normal.        Thought Content: Thought content normal.        Judgment: Judgment normal.      Assessment:  Mr. Billy Alvarado is a 63 y.o. male  who presents today for Colonoscopy for Colorectal cancer screening .  Plan:  Colonoscopy with possible intervention today  Colonoscopy with possible biopsy, control of bleeding, polypectomy, and interventions as necessary has been discussed with the patient/patient representative. Informed consent was obtained from the patient/patient representative after explaining the indication, nature, and risks of the procedure including but not limited to death, bleeding, perforation, missed neoplasm/lesions, cardiorespiratory compromise, and reaction to medications. Opportunity for questions was given and appropriate answers were provided. Patient/patient representative has verbalized understanding is amenable to undergoing the procedure.   Quintin Buckle, DO  [facility]  Portions of the record may have been created with voice recognition software. Occasional wrong-word or 'sound-a-like' substitutions may have occurred due to the inherent limitations of voice recognition software.  Read the chart carefully and recognize, using context, where substitutions may have occurred.

## 2023-07-04 ENCOUNTER — Encounter
Admission: RE | Disposition: A | Discharge status: Home / Self Care | Payer: Self-pay | Source: Home / Self Care | Attending: Gastroenterology

## 2023-07-04 ENCOUNTER — Encounter: Payer: Self-pay | Admitting: Gastroenterology

## 2023-07-04 ENCOUNTER — Ambulatory Visit: Payer: 59 | Admitting: Anesthesiology

## 2023-07-04 ENCOUNTER — Other Ambulatory Visit: Payer: Self-pay

## 2023-07-04 ENCOUNTER — Ambulatory Visit
Admission: RE | Admit: 2023-07-04 | Discharge: 2023-07-04 | Disposition: A | Discharge status: Home / Self Care | Payer: 59 | Attending: Gastroenterology | Admitting: Gastroenterology

## 2023-07-04 DIAGNOSIS — Z7984 Long term (current) use of oral hypoglycemic drugs: Secondary | ICD-10-CM | POA: Diagnosis not present

## 2023-07-04 DIAGNOSIS — I10 Essential (primary) hypertension: Secondary | ICD-10-CM | POA: Diagnosis not present

## 2023-07-04 DIAGNOSIS — E119 Type 2 diabetes mellitus without complications: Secondary | ICD-10-CM | POA: Diagnosis not present

## 2023-07-04 DIAGNOSIS — D123 Benign neoplasm of transverse colon: Secondary | ICD-10-CM | POA: Insufficient documentation

## 2023-07-04 DIAGNOSIS — G4730 Sleep apnea, unspecified: Secondary | ICD-10-CM | POA: Diagnosis not present

## 2023-07-04 DIAGNOSIS — K640 First degree hemorrhoids: Secondary | ICD-10-CM | POA: Diagnosis not present

## 2023-07-04 DIAGNOSIS — Z1211 Encounter for screening for malignant neoplasm of colon: Secondary | ICD-10-CM | POA: Diagnosis present

## 2023-07-04 HISTORY — PX: POLYPECTOMY: SHX5525

## 2023-07-04 HISTORY — PX: COLONOSCOPY WITH PROPOFOL: SHX5780

## 2023-07-04 HISTORY — DX: Sleep apnea, unspecified: G47.30

## 2023-07-04 HISTORY — DX: Personal history of urinary calculi: Z87.442

## 2023-07-04 LAB — GLUCOSE, CAPILLARY: Glucose-Capillary: 161 mg/dL — ABNORMAL HIGH (ref 70–99)

## 2023-07-04 SURGERY — COLONOSCOPY WITH PROPOFOL
Anesthesia: General

## 2023-07-04 MED ORDER — PROPOFOL 10 MG/ML IV BOLUS
INTRAVENOUS | Status: DC | PRN
  Administered 2023-07-04: 50 mg via INTRAVENOUS
  Administered 2023-07-04: 30 mg via INTRAVENOUS

## 2023-07-04 MED ORDER — DEXMEDETOMIDINE HCL IN NACL 80 MCG/20ML IV SOLN
INTRAVENOUS | Status: DC | PRN
  Administered 2023-07-04: 20 ug via INTRAVENOUS

## 2023-07-04 MED ORDER — LIDOCAINE HCL (CARDIAC) PF 100 MG/5ML IV SOSY
PREFILLED_SYRINGE | INTRAVENOUS | Status: DC | PRN
  Administered 2023-07-04: 60 mg via INTRAVENOUS

## 2023-07-04 MED ORDER — STERILE WATER FOR IRRIGATION IR SOLN: Status: DC | PRN

## 2023-07-04 MED ORDER — PROPOFOL 500 MG/50ML IV EMUL
INTRAVENOUS | Status: DC | PRN
  Administered 2023-07-04: 75 ug/kg/min via INTRAVENOUS

## 2023-07-04 MED ORDER — SODIUM CHLORIDE 0.9 % IV SOLN: INTRAVENOUS | Status: DC

## 2023-07-04 NOTE — Transfer of Care (Signed)
 Immediate Anesthesia Transfer of Care Note  Patient: Billy Alvarado  Procedure(s) Performed: COLONOSCOPY WITH PROPOFOL  POLYPECTOMY  Patient Location: PACU  Anesthesia Type:General  Level of Consciousness: sedated  Airway & Oxygen Therapy: Patient Spontanous Breathing  Post-op Assessment: Report given to RN and Post -op Vital signs reviewed and stable  Post vital signs: Reviewed and stable  Last Vitals:  Vitals Value Taken Time  BP    Temp    Pulse    Resp    SpO2      Last Pain:  Vitals:   07/04/23 0829  TempSrc: Temporal  PainSc: 0-No pain         Complications: No notable events documented.

## 2023-07-04 NOTE — Anesthesia Postprocedure Evaluation (Signed)
 Anesthesia Post Note  Patient: Latham Saint  Procedure(s) Performed: COLONOSCOPY WITH PROPOFOL  POLYPECTOMY  Patient location during evaluation: PACU Anesthesia Type: General Level of consciousness: awake and alert Pain management: pain level controlled Vital Signs Assessment: post-procedure vital signs reviewed and stable Respiratory status: spontaneous breathing, nonlabored ventilation and respiratory function stable Cardiovascular status: blood pressure returned to baseline and stable Postop Assessment: no apparent nausea or vomiting Anesthetic complications: no   No notable events documented.   Last Vitals:  Vitals:   07/04/23 0923 07/04/23 0931  BP: (!) 87/53 (!) 100/55  Pulse: 70 72  Resp: 16 17  Temp:    SpO2: 97% 100%    Last Pain:  Vitals:   07/04/23 0931  TempSrc:   PainSc: 0-No pain                 Baltazar Bonier

## 2023-07-04 NOTE — Op Note (Signed)
 [facility] Gastroenter[facility] Patient Name: Billy Alvarado Procedure Date: 07/04/2023 8:20 AM MRN: [id number] Account #: [id number] Date of Birth: [birth year] Admit Type: Outpatient Age: 63 Room: [facility] ENDO ROOM 2 Gender: Male Note Status: Finalized Instrument Name: Colonoscope [id number] Procedure:             Colonoscopy Indications:           Screening for colorectal malignant neoplasm Providers:             Bridgett Camps, DO Referring MD:          Quintin Buckle DO, DO (Referring MD), Antonio Baumgarten, MD (Referring MD) Medicines:             Monitored Anesthesia Care Complications:         No immediate complications. Estimated blood loss:                         Minimal. Procedure:             Pre-Anesthesia Assessment:                        - Prior to the procedure, a History and Physical was                         performed, and patient medications and allergies were                         reviewed. The patient is competent. The risks and                         benefits of the procedure and the sedation options and                         risks were discussed with the patient. All questions                         were answered and informed consent was obtained.                         Patient identification and proposed procedure were                         verified by the physician, the nurse, the anesthetist                         and the technician in the endoscopy suite. Mental                         Status Examination: alert and oriented. Airway                         Examination: normal oropharyngeal airway and neck                         mobility. Respiratory Examination: clear to  auscultation. CV Examination: RRR, no murmurs, no S3                         or S4. Prophylactic Antibiotics: The patient does not                         require prophylactic antibiotics. Prior                          Anticoagulants: The patient has taken no anticoagulant                         or antiplatelet agents. ASA Grade Assessment: II - A                         patient with mild systemic disease. After reviewing                         the risks and benefits, the patient was deemed in                         satisfactory condition to undergo the procedure. The                         anesthesia plan was to use monitored anesthesia care                         (MAC). Immediately prior to administration of                         medications, the patient was re-assessed for adequacy                         to receive sedatives. The heart rate, respiratory                         rate, oxygen saturations, blood pressure, adequacy of                         pulmonary ventilation, and response to care were                         monitored throughout the procedure. The physical                         status of the patient was re-assessed after the                         procedure.                        After obtaining informed consent, the colonoscope was                         passed under direct vision. Throughout the procedure,                         the patient's blood pressure, pulse, and oxygen  saturations were monitored continuously. The                         Colonoscope was introduced through the anus and                         advanced to the the terminal ileum, with                         identification of the appendiceal orifice and IC                         valve. The colonoscopy was performed without                         difficulty. The patient tolerated the procedure well.                         The quality of the bowel preparation was evaluated                         using the BBPS [facility] Bowel Preparation Scale) with                         scores of: Right Colon = 3, Transverse Colon = 3 and                         Left Colon = 3  (entire mucosa seen well with no                         residual staining, small fragments of stool or opaque                         liquid). The total BBPS score equals 9. The terminal                         ileum, ileocecal valve, appendiceal orifice, and                         rectum were photographed. Findings:      The perianal and digital rectal examinations were normal. Pertinent       negatives include normal sphincter tone.      The terminal ileum appeared normal.      A 1 to 2 mm polyp was found in the transverse colon. The polyp was       sessile. The polyp was removed with a jumbo cold forceps. Resection and       retrieval were complete. Estimated blood loss was minimal.      Non-bleeding internal hemorrhoids were found during retroflexion. The       hemorrhoids were Grade I (internal hemorrhoids that do not prolapse).       Estimated blood loss: none.      The exam was otherwise without abnormality on direct and retroflexion       views. Impression:            - The examined portion of the ileum was normal.                        -  One 1 to 2 mm polyp in the transverse colon, removed                         with a jumbo cold forceps. Resected and retrieved.                        - Non-bleeding internal hemorrhoids.                        - The examination was otherwise normal on direct and                         retroflexion views. Recommendation:        - Patient has a contact number available for                         emergencies. The signs and symptoms of potential                         delayed complications were discussed with the patient.                         Return to normal activities tomorrow. Written                         discharge instructions were provided to the patient.                        - Discharge patient to home.                        - Resume previous diet.                        - Continue present medications.                         - Await pathology results.                        - Repeat colonoscopy for surveillance based on                         pathology results.                        - Return to referring physician as previously                         scheduled.                        - The findings and recommendations were discussed with                         the patient. Procedure Code(s):     --- Professional ---                        [phone], Colonoscopy, flexible; with biopsy, single or  multiple Diagnosis Code(s):     --- Professional ---                        Z12.11, Encounter for screening for malignant neoplasm                         of colon                        K64.0, First degree hemorrhoids                        D12.3, Benign neoplasm of transverse colon (hepatic                         flexure or splenic flexure) CPT copyright 2022 American Medical Association. All rights reserved. The codes documented in this report are preliminary and upon coder review may  be revised to meet current compliance requirements. Attending Participation:      I personally performed the entire procedure. Polo Brisk, DO Quintin Buckle DO, DO 07/04/2023 9:05:00 AM This report has been signed electronically. Number of Addenda: 0 Note Initiated On: 07/04/2023 8:20 AM Scope Withdrawal Time: 0 hours 11 minutes 40 seconds  Total Procedure Duration: 0 hours 15 minutes 2 seconds  Estimated Blood Loss:  Estimated blood loss was minimal.      [facility]

## 2023-07-04 NOTE — Anesthesia Preprocedure Evaluation (Addendum)
 Anesthesia Evaluation  Patient identified by MRN, date of birth, ID band Patient awake    Reviewed: Allergy & Precautions, H&P , NPO status , Patient's Chart, lab work & pertinent test results  Airway Mallampati: III  TM Distance: >3 FB Neck ROM: full    Dental no notable dental hx.    Pulmonary sleep apnea    Pulmonary exam normal        Cardiovascular hypertension, Normal cardiovascular exam     Neuro/Psych negative neurological ROS  negative psych ROS   GI/Hepatic Neg liver ROS,GERD  ,,  Endo/Other  diabetes    Renal/GU negative Renal ROS  negative genitourinary   Musculoskeletal   Abdominal  (+) + obese  Peds  Hematology negative hematology ROS (+)   Anesthesia Other Findings Past Medical History: No date: Diabetes mellitus without complication (HCC) No date: Hypertension      Reproductive/Obstetrics negative OB ROS                             Anesthesia Physical Anesthesia Plan  ASA: 2  Anesthesia Plan: General   Post-op Pain Management:    Induction: Intravenous  PONV Risk Score and Plan: Propofol  infusion and TIVA  Airway Management Planned: Natural Airway  Additional Equipment:   Intra-op Plan:   Post-operative Plan:   Informed Consent: I have reviewed the patients History and Physical, chart, labs and discussed the procedure including the risks, benefits and alternatives for the proposed anesthesia with the patient or authorized representative who has indicated his/her understanding and acceptance.     Dental Advisory Given  Plan Discussed with: CRNA and Surgeon  Anesthesia Plan Comments:         Anesthesia Quick Evaluation

## 2023-07-04 NOTE — Interval H&P Note (Signed)
 History and Physical Interval Note: Preprocedure H&P from 07/04/23  was reviewed and there was no interval change after seeing and examining the patient.  Written consent was obtained from the patient after discussion of risks, benefits, and alternatives. Patient has consented to proceed with Colonoscopy with possible intervention   07/04/2023 8:36 AM  Billy Alvarado  has presented today for surgery, with the diagnosis of Z12.11 (ICD-10-CM) - Colon cancer screening.  The various methods of treatment have been discussed with the patient and family. After consideration of risks, benefits and other options for treatment, the patient has consented to  Procedure(s): COLONOSCOPY WITH PROPOFOL  (N/A) as a surgical intervention.  The patient's history has been reviewed, patient examined, no change in status, stable for surgery.  I have reviewed the patient's chart and labs.  Questions were answered to the patient's satisfaction.     Quintin Buckle

## 2023-07-05 ENCOUNTER — Encounter: Payer: Self-pay | Admitting: Gastroenterology

## 2023-07-05 LAB — SURGICAL PATHOLOGY
